# Patient Record
Sex: Female | Born: 1955 | Hispanic: No | Smoking: Former smoker
Health system: Southern US, Community
[De-identification: ages and names within clinical notes are randomized; demographics above are authoritative.]

## PROBLEM LIST (undated history)

## (undated) DIAGNOSIS — Z780 Asymptomatic menopausal state: Secondary | ICD-10-CM

## (undated) DIAGNOSIS — E785 Hyperlipidemia, unspecified: Secondary | ICD-10-CM

## (undated) HISTORY — DX: Asymptomatic menopausal state: Z78.0

## (undated) HISTORY — DX: Hyperlipidemia, unspecified: E78.5

## (undated) HISTORY — PX: NOSE SURGERY: SHX723

## (undated) HISTORY — PX: CARPAL TUNNEL RELEASE: SHX101

---

## 2010-08-02 ENCOUNTER — Encounter: Payer: Self-pay | Admitting: Cardiology

## 2011-04-03 ENCOUNTER — Encounter: Payer: Self-pay | Admitting: Internal Medicine

## 2011-04-03 ENCOUNTER — Ambulatory Visit (HOSPITAL_BASED_OUTPATIENT_CLINIC_OR_DEPARTMENT_OTHER)
Admission: RE | Admit: 2011-04-03 | Discharge: 2011-04-03 | Disposition: A | Discharge status: Home / Self Care | Payer: BC Managed Care – PPO | Source: Ambulatory Visit | Attending: Internal Medicine | Admitting: Internal Medicine

## 2011-04-03 ENCOUNTER — Ambulatory Visit (INDEPENDENT_AMBULATORY_CARE_PROVIDER_SITE_OTHER): Payer: BC Managed Care – PPO | Admitting: Internal Medicine

## 2011-04-03 DIAGNOSIS — R635 Abnormal weight gain: Secondary | ICD-10-CM

## 2011-04-03 DIAGNOSIS — M542 Cervicalgia: Secondary | ICD-10-CM

## 2011-04-03 DIAGNOSIS — R079 Chest pain, unspecified: Secondary | ICD-10-CM

## 2011-04-03 DIAGNOSIS — M5030 Other cervical disc degeneration, unspecified cervical region: Secondary | ICD-10-CM

## 2011-04-04 ENCOUNTER — Encounter: Payer: Self-pay | Admitting: Internal Medicine

## 2011-04-04 DIAGNOSIS — Z87891 Personal history of nicotine dependence: Secondary | ICD-10-CM | POA: Insufficient documentation

## 2011-04-04 DIAGNOSIS — E785 Hyperlipidemia, unspecified: Secondary | ICD-10-CM | POA: Insufficient documentation

## 2011-04-04 NOTE — Progress Notes (Signed)
Subjective:    Patient ID: Lindsey Travis, female    DOB: 1955-09-11, 55 y.o.   MRN: [id number]  HPI New pt here for first visit.  No primary care.  GYN Dr. Tenny Craw.    Pleasant pt. Who tells me she knows she does not take care of herself.  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  Did have recent GYN exam with Dr. Tenny Craw that showed mild hyperlipidemia with LDL of 136  Total 224.  Miral describes recent chest pain that will radiate down L arm  Associated with SOB, no N?V or diaphoresis.  No true exertional component.  She reports she was evaluated at [facility] on 10/25, had a normal EKG and was told to follow up for further work up with a cardiologist but she had to cancel appt.  She really has not had any further chest pain since  She also has neck pain for several months.  Pain with ROM.  She is on no meds and does not like "to take pills"  EKG [place] nonspecific TWI V1 no acute changes.  /risk factor  Pt a former smoker, FH mother CVA,  Pt has mild hyperlipidemia  She is also concerned aabout weight gain  No Known Allergies Past Medical History  Diagnosis Date  . Menopause   . Hyperlipidemia    Past Surgical History  Procedure Date  . Cesarean section 11/29/97, 07/17/00  . Nose surgery    History   Social History  . Marital Status: Legally Separated    Spouse Name: N/A    Number of Children: N/A  . Years of Education: N/A   Occupational History  . Not on file.   Social History Main Topics  . Smoking status: Former Smoker    Quit date: 06/03/2006  . Smokeless tobacco: Never Used  . Alcohol Use: No  . Drug Use: No  . Sexually Active: No   Other Topics Concern  . Not on file   Social History Narrative  . No narrative on file   Family History  Problem Relation Age of Onset  . Heart disease Mother     valve replacement  . Diabetes Father   . Heart disease Father     pacemaker   Patient Active Problem  List  Diagnoses  . Hyperlipidemia   No current outpatient prescriptions on file prior to visit.        Review of Systems    see HPI Objective:   Physical Exam Physical Exam  Nursing note and vitals reviewed.  Constitutional: She is oriented to person, place, and time. She appears well-developed and well-nourished.  HENT:  Head: Normocephalic and atraumatic.  Cardiovascular: Normal rate and regular rhythm. Exam reveals no gallop and no friction rub.  No murmur heard.  Pulmonary/Chest: Breath sounds normal. She has no wheezes. She has no rales.  Neurological: She is alert and oriented to person, place, and time.  Skin: Skin is warm and dry.  Psychiatric: She has a normal mood and affect. Her behavior is normal.        Assessment & Plan:  1)  Atypical chest pain with risk factors in former smoker  Will refer to Dr. Jens Som for further work-up 2)  Neck pain  Will get plain films today.  OK to take Nsaid of choice 3)  Mild Hyperlipidemia  Dash diet for now   Recheck fasting levels 3-6 months 4)  Weight  gain  Will check TSH.  Discuss further at subsequent visit

## 2011-04-04 NOTE — Patient Instructions (Signed)
Keep appt with Dr. Jens Som  To X-ray today

## 2011-04-05 ENCOUNTER — Encounter: Payer: Self-pay | Admitting: Emergency Medicine

## 2011-04-08 ENCOUNTER — Encounter: Payer: Self-pay | Admitting: Internal Medicine

## 2011-04-18 ENCOUNTER — Encounter: Payer: Self-pay | Admitting: Cardiology

## 2011-04-18 ENCOUNTER — Ambulatory Visit (INDEPENDENT_AMBULATORY_CARE_PROVIDER_SITE_OTHER): Payer: BC Managed Care – PPO | Admitting: Cardiology

## 2011-04-18 DIAGNOSIS — R079 Chest pain, unspecified: Secondary | ICD-10-CM

## 2011-04-18 DIAGNOSIS — E785 Hyperlipidemia, unspecified: Secondary | ICD-10-CM

## 2011-04-18 NOTE — Progress Notes (Signed)
HPI: 55 yo female with no prior cardiac history for evaluation of chest pain. Patient states 3 weeks ago she had substernal chest pain. It was described as a pressure and there was radiation to her left upper extremity. There was shortness of breath but no diaphoresis or nausea. The pain was not pleuritic or positional. It resolved spontaneously. It lasted approximately 5-10 minutes. She otherwise denies dyspnea on exertion, orthopnea, PND, pedal edema or syncope. She has had no further chest pain. Because of the above were asked to further evaluate. Note patient was seen at urgent care 2 days after the above.  No current outpatient prescriptions on file.    No Known Allergies  Past Medical History  Diagnosis Date  . Menopause   . Hyperlipidemia     Past Surgical History  Procedure Date  . Cesarean section 11/29/97, 07/17/00  . Nose surgery     History   Social History  . Marital Status: Legally Separated    Spouse Name: N/A    Number of Children: 2  . Years of Education: N/A   Occupational History  .      Teacher   Social History Main Topics  . Smoking status: Former Smoker    Quit date: 06/03/2006  . Smokeless tobacco: Never Used  . Alcohol Use: No  . Drug Use: No  . Sexually Active: No   Other Topics Concern  . Not on file   Social History Narrative  . No narrative on file    Family History  Problem Relation Age of Onset  . Heart disease Mother     valve replacement  . Diabetes Father   . Heart disease Father     pacemaker    ROS:no fevers or chills, productive cough, hemoptysis, dysphasia, odynophagia, melena, hematochezia, dysuria, hematuria, rash, seizure activity, orthopnea, PND, pedal edema, claudication. Remaining systems are negative.  Physical Exam:   Blood pressure 97/59, pulse 63, weight 141 lb 6.4 oz (64.139 kg).  General:  Well developed/well nourished in NAD Skin warm/dry Patient not depressed No peripheral  clubbing Back-normal HEENT-normal/normal eyelids Neck supple/normal carotid upstroke bilaterally; no bruits; no JVD; no thyromegaly chest - CTA/ normal expansion CV - RRR/normal S1 and S2; no murmurs, rubs or gallops;  PMI nondisplaced Abdomen -NT/ND, no HSM, no mass, + bowel sounds, no bruit 2+ femoral pulses, no bruits Ext-no edema, chords, 2+ DP Neuro-grossly nonfocal  ECG 03/28/11 Sinus bradycardia, no ST changes

## 2011-04-18 NOTE — Assessment & Plan Note (Signed)
Symptoms with both typical and atypical features. Schedule stress echocardiogram for risk stratification.

## 2011-04-18 NOTE — Assessment & Plan Note (Signed)
Management per primary care. 

## 2011-04-18 NOTE — Patient Instructions (Signed)
Your physician has requested that you have a stress echocardiogram. For further information please visit [email or web address]. Please follow instruction sheet as given.   

## 2011-04-26 ENCOUNTER — Other Ambulatory Visit (HOSPITAL_COMMUNITY): Payer: BC Managed Care – PPO | Admitting: Radiology

## 2011-05-02 ENCOUNTER — Ambulatory Visit (INDEPENDENT_AMBULATORY_CARE_PROVIDER_SITE_OTHER): Payer: BC Managed Care – PPO | Admitting: Internal Medicine

## 2011-05-02 VITALS — BP 109/69 | HR 68 | Temp 96.8°F | Ht 67.5 in | Wt 136.0 lb

## 2011-05-02 DIAGNOSIS — M5030 Other cervical disc degeneration, unspecified cervical region: Secondary | ICD-10-CM

## 2011-05-02 DIAGNOSIS — N951 Menopausal and female climacteric states: Secondary | ICD-10-CM

## 2011-05-02 DIAGNOSIS — Z780 Asymptomatic menopausal state: Secondary | ICD-10-CM

## 2011-05-02 DIAGNOSIS — E785 Hyperlipidemia, unspecified: Secondary | ICD-10-CM

## 2011-05-02 NOTE — Patient Instructions (Signed)
Schedule CPe with me in 3 months   Come in fasting for labs

## 2011-05-02 NOTE — Progress Notes (Signed)
  Subjective:    Patient ID: Lindsey Travis, female    DOB: November 13, 1955, 55 y.o.   MRN: [id number]  HPI Lindsey Travis is here for follow up.  Neck imaging shows DDD.  She states her neck is improved but she feels pain in both shoulders.  She is pending  A stress ECHO.    She has FH of osteoporosis in mother and would like her bone density  See Lipids  She doesn not want to take meds and has changed her diet to DASH diet.  She is happy she lost 4 lbs.    No Known Allergies Past Medical History  Diagnosis Date  . Menopause   . Hyperlipidemia    Past Surgical History  Procedure Date  . Cesarean section 11/29/97, 07/17/00  . Nose surgery    History   Social History  . Marital Status: Legally Separated    Spouse Name: N/A    Number of Children: 2  . Years of Education: N/A   Occupational History  .      Teacher   Social History Main Topics  . Smoking status: Former Smoker    Quit date: 06/03/2006  . Smokeless tobacco: Never Used  . Alcohol Use: No  . Drug Use: No  . Sexually Active: No   Other Topics Concern  . Not on file   Social History Narrative  . No narrative on file   Family History  Problem Relation Age of Onset  . Heart disease Mother     valve replacement  . Diabetes Father   . Heart disease Father     pacemaker   Patient Active Problem List  Diagnoses  . Hyperlipidemia  . History of tobacco use  . Chest pain   No current outpatient prescriptions on file prior to visit.       Review of Systems See HPI    Objective:   Physical Exam Physical Exam  Nursing note and vitals reviewed.  Constitutional: She is oriented to person, place, and time. She appears well-developed and well-nourished.  HENT:  Head: Normocephalic and atraumatic.  Cardiovascular: Normal rate and regular rhythm. Exam reveals no gallop and no friction rub.  No murmur heard.  Pulmonary/Chest: Breath sounds normal. She has no wheezes. She has no rales.  Neurological: She is alert and  oriented to person, place, and time.  Skin: Skin is warm and dry.  Psychiatric: She has a normal mood and affect. Her behavior is normal.          Assessment & Plan:  1)  DDD Nsaid of choice  OK for gently massage 2)  Hyperlipidemia  contineu DASH diet  Recheck fasting in 3 months at CPE 3)  Menopause FH osteoporosis  Will check bone density  Schedule CPe

## 2011-05-03 ENCOUNTER — Ambulatory Visit (HOSPITAL_COMMUNITY): Payer: BC Managed Care – PPO | Attending: Cardiology | Admitting: Radiology

## 2011-05-03 DIAGNOSIS — R0989 Other specified symptoms and signs involving the circulatory and respiratory systems: Secondary | ICD-10-CM | POA: Insufficient documentation

## 2011-05-03 DIAGNOSIS — R0609 Other forms of dyspnea: Secondary | ICD-10-CM | POA: Insufficient documentation

## 2011-05-03 DIAGNOSIS — R072 Precordial pain: Secondary | ICD-10-CM

## 2011-05-03 DIAGNOSIS — E785 Hyperlipidemia, unspecified: Secondary | ICD-10-CM | POA: Insufficient documentation

## 2011-05-23 ENCOUNTER — Ambulatory Visit
Admission: RE | Admit: 2011-05-23 | Discharge: 2011-05-23 | Disposition: A | Discharge status: Home / Self Care | Payer: BC Managed Care – PPO | Source: Ambulatory Visit | Attending: Internal Medicine | Admitting: Internal Medicine

## 2011-05-23 DIAGNOSIS — Z780 Asymptomatic menopausal state: Secondary | ICD-10-CM

## 2011-06-03 ENCOUNTER — Encounter: Payer: Self-pay | Admitting: Internal Medicine

## 2011-06-03 DIAGNOSIS — M8580 Other specified disorders of bone density and structure, unspecified site: Secondary | ICD-10-CM | POA: Insufficient documentation

## 2011-06-12 ENCOUNTER — Encounter: Payer: Self-pay | Admitting: Emergency Medicine

## 2011-06-12 ENCOUNTER — Telehealth: Payer: Self-pay | Admitting: Emergency Medicine

## 2011-06-12 NOTE — Telephone Encounter (Signed)
error 

## 2012-02-13 ENCOUNTER — Ambulatory Visit (INDEPENDENT_AMBULATORY_CARE_PROVIDER_SITE_OTHER): Payer: BC Managed Care – PPO | Admitting: Internal Medicine

## 2012-02-13 ENCOUNTER — Encounter: Payer: Self-pay | Admitting: Internal Medicine

## 2012-02-13 VITALS — BP 100/54 | HR 74 | Temp 98.0°F | Resp 20 | Wt 135.0 lb

## 2012-02-13 DIAGNOSIS — M549 Dorsalgia, unspecified: Secondary | ICD-10-CM

## 2012-02-13 DIAGNOSIS — M5430 Sciatica, unspecified side: Secondary | ICD-10-CM

## 2012-02-13 MED ORDER — IBUPROFEN 800 MG PO TABS: ORAL_TABLET | ORAL | Status: AC

## 2012-02-13 NOTE — Patient Instructions (Signed)
See me as needed 

## 2012-02-13 NOTE — Progress Notes (Signed)
  Subjective:    Patient ID: Lindsey Travis, female    DOB: 08-Dec-1955, 57 y.o.   MRN: [id number]  HPI Lindsey Travis is here for acute visit.  She describes one week ago she was walking two miles and the next day had severe low back pain in L/S area.    Getting better off and on.  She has parethesias in R thigh off and on.  She tried to do stretching exercises  But made it worse  No dysuria or urgency  No Known Allergies Past Medical History  Diagnosis Date  . Menopause   . Hyperlipidemia    Past Surgical History  Procedure Date  . Cesarean section 11/29/97, 07/17/00  . Nose surgery    History   Social History  . Marital Status: Legally Separated    Spouse Name: N/A    Number of Children: 2  . Years of Education: N/A   Occupational History  .      Teacher   Social History Main Topics  . Smoking status: Former Smoker    Quit date: 06/03/2006  . Smokeless tobacco: Never Used  . Alcohol Use: No  . Drug Use: No  . Sexually Active: No   Other Topics Concern  . Not on file   Social History Narrative  . No narrative on file   Family History  Problem Relation Age of Onset  . Heart disease Mother     valve replacement  . Diabetes Father   . Heart disease Father     pacemaker   Patient Active Problem List  Diagnosis  . Hyperlipidemia  . History of tobacco use  . Chest pain  . DDD (degenerative disc disease), cervical  . Osteopenia   No current outpatient prescriptions on file prior to visit.      Review of Systems    see HPI Objective:   Physical Exam  Physical Exam  Nursing note and vitals reviewed.  Constitutional: She is oriented to person, place, and time. She appears well-developed and well-nourished.  HENT:  Head: Normocephalic and atraumatic.  Cardiovascular: Normal rate and regular rhythm. Exam reveals no gallop and no friction rub.  No murmur heard.  Pulmonary/Chest: Breath sounds normal. She has no wheezes. She has no rales.  Neurological: She is  alert and oriented to person, place, and time.  Skin: Skin is warm and dry.  Psychiatric: She has a normal mood and affect. Her behavior is normal.  Neurologic lower extremities:  Reflexes 2+ symmetric, SLR pos. On right.  Motor 5/5 upper and lower extremeities.    Paraspinal muscle tenderness and stiffness L/S area                  Assessment & Plan:  Sciatica:  OK for Ibuprofen 800 mg tid for the next week.  No weight lifting.   Ice to back  If not improvement see me in office

## 2014-04-04 ENCOUNTER — Encounter: Payer: Self-pay | Admitting: Internal Medicine

## 2015-08-28 ENCOUNTER — Telehealth: Payer: Self-pay | Admitting: Acute Care

## 2015-08-28 NOTE — Telephone Encounter (Signed)
ATC pt to discuss lung cancer screening referral.  A female answered phone, when asked to speak w/ patient, the line was d/c'd I called back and asked to speak with patient and female voice then stated "not to call back" and hung the phone up. Will inform referring provider.

## 2015-08-29 NOTE — Telephone Encounter (Signed)
This is not my patient.

## 2016-12-10 ENCOUNTER — Ambulatory Visit (INDEPENDENT_AMBULATORY_CARE_PROVIDER_SITE_OTHER): Payer: 59 | Admitting: Pulmonary Disease

## 2016-12-10 ENCOUNTER — Encounter: Payer: Self-pay | Admitting: Pulmonary Disease

## 2016-12-10 ENCOUNTER — Ambulatory Visit (INDEPENDENT_AMBULATORY_CARE_PROVIDER_SITE_OTHER)
Admission: RE | Admit: 2016-12-10 | Discharge: 2016-12-10 | Disposition: A | Discharge status: Home / Self Care | Payer: 59 | Source: Ambulatory Visit | Attending: Pulmonary Disease | Admitting: Pulmonary Disease

## 2016-12-10 DIAGNOSIS — R0600 Dyspnea, unspecified: Secondary | ICD-10-CM

## 2016-12-10 NOTE — Patient Instructions (Signed)
   Call or e-mail me if you notice any new breathing problems or have questions before your next appointment.  We will review your test results at your next appointment. Otherwise I will contact you if something needs to be done sooner.  TESTS ORDERED: 1. Full PFTs on or before next appointment 2. CXR PA/LAT today

## 2016-12-10 NOTE — Progress Notes (Signed)
Subjective:    Patient ID: Lindsey Travis, female    DOB: [birth year], 61 y.o.   MRN: [id number]  HPI She reports starting in April she developed a cough as well as chest discomfort in her chest with the cough. She had sinus congestion & drainage at that time as well. She reports she was never treated during that time nor did she have any chest imaging. No wheezing that she recalls during that time. She reports she does have intermittent sweats but no fever or chills. She reports her chest discomfort resolved. She reports she reports she does have coughing with deep inspiration at times. No history of asthma or breathing problems as a young adult or child. She denies any history of pneumonia or bronchitis. No history of allergies or environmental problems preceding this year. No reflux, dyspepsia, or morning brash water taste. No rashes or bruising. She does report problems with dyspnea with exposure to chemical fumes, dust and smoke.   Review of Systems No joint swelling, erythema or stiffness. No dysuria or hematuria. A pertinent 14 point review of systems is negative except as per the history of presenting illness.  No Known Allergies  Current Outpatient Prescriptions on File Prior to Visit  Medication Sig Dispense Refill  . ibuprofen (ADVIL,MOTRIN) 800 MG tablet Take one talblet 3 times a day with food for one week (Patient not taking: Reported on 12/10/2016) 30 tablet 1   No current facility-administered medications on file prior to visit.     Past Medical History:  Diagnosis Date  . Hyperlipidemia   . Menopause     Past Surgical History:  Procedure Laterality Date  . CARPAL TUNNEL RELEASE Right   . CESAREAN SECTION  11/29/97, 07/17/00  . NOSE SURGERY      Family History  Problem Relation Age of Onset  . Heart disease Mother        valve replacement  . Diabetes Father   . Heart disease Father        pacemaker  . Lung disease Father   . Lung cancer Brother   . Lung disease  Paternal Uncle   . Lung disease Paternal Uncle   . Cystic fibrosis Other   . Rheumatologic disease Neg Hx     Social History   Social History  . Marital status: Legally Separated    Spouse name: N/A  . Number of children: 2  . Years of education: N/A   Occupational History  .  American Surveyor, minerals   Social History Main Topics  . Smoking status: Former Smoker    Packs/day: 0.50    Years: 28.00    Start date: 12/04/1975    Quit date: 06/04/2003  . Smokeless tobacco: Never Used  . Alcohol use No  . Drug use: No  . Sexual activity: No   Other Topics Concern  . None   Social History Narrative   [facility] [facility] (12/10/16):   Originally from [place]. She moved  To the [place] in 1997. 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 No pets currently. No bird exposure. Questionable mold exposure. She traveled to [place] 2 years ago but nowhere recently. Has also traveled to [place], [place], & [place]. She has always lived in [place].       Objective:   Physical Exam BP 112/68 (BP Location:  Right Arm, Patient Position: Sitting, Cuff Size: Normal)   Pulse 77   Ht 5' 7.5" (1.715 m)   Wt 137 lb (62.1 kg)   SpO2 98%   BMI 21.14 kg/m  General:  Awake. Alert. No acute distress. Thin female. Integument:  Warm & dry. No rash on exposed skin. No bruising on exposed skin. Extremities:  No cyanosis or clubbing.  Lymphatics:  No appreciated cervical or supraclavicular lymphadenoapthy. HEENT:  Moist mucus membranes. No oral ulcers. No scleral injection or icterus. Narrowed nasal passages with moderate bilateral nasal turbinate swelling. Cardiovascular:  Regular rate. No edema. No appreciable JVD.  Pulmonary:  Good aeration & clear to auscultation bilaterally. Symmetric chest wall expansion. No accessory muscle use on room air. Abdomen: Soft. Normal  bowel sounds. Nondistended. Grossly nontender. Musculoskeletal:  Normal bulk and tone. Hand grip strength 5/5 bilaterally. No joint deformity or effusion appreciated. Neurological:  CN 2-12 grossly in tact. No meningismus. Moving all 4 extremities equally. Symmetric brachioradialis deep tendon reflexes. Psychiatric:  Mood and affect congruent. Speech normal rhythm, rate & tone.     Assessment & Plan:  61 y.o. female with acute illness this spring and prolonged recovery. Patient does have ongoing dyspnea with difficulty performing deep inspiration. With her family history of cystic fibrosis as well as multiple different lung diseases I do question whether or not she could be experiencing the insidious onset of a parenchymal lung disease. However, this could represent prolonged recovery from acute illness or reactive airways disease. I instructed the patient contact my office if she had any new breathing problems or questions before next appointment.  1. Dyspnea: Checking full pulmonary function testing on or before next appointment. Checking chest x-ray PA/LAT today. Further testing pending the results. 2. Follow-up: Patient to return to clinic in 6 weeks or sooner if needed.  Donna ChristenJennings E. Jamison NeighborNestor, M.D. [facility] Pager:  [phone] After 3pm or if no response, call [phone] 10:34 AM 12/10/16

## 2016-12-12 NOTE — Progress Notes (Signed)
Spoke with patient and informed her of results. Pt did not have any questions and verbalized understanding. Nothing further is needed.

## 2017-01-28 ENCOUNTER — Ambulatory Visit (INDEPENDENT_AMBULATORY_CARE_PROVIDER_SITE_OTHER): Payer: 59 | Admitting: Pulmonary Disease

## 2017-01-28 ENCOUNTER — Encounter: Payer: Self-pay | Admitting: Pulmonary Disease

## 2017-01-28 VITALS — BP 98/60 | HR 65 | Ht 65.5 in | Wt 138.4 lb

## 2017-01-28 DIAGNOSIS — R0600 Dyspnea, unspecified: Secondary | ICD-10-CM | POA: Diagnosis not present

## 2017-01-28 LAB — PULMONARY FUNCTION TEST
DL/VA % pred: 90 %
DL/VA: 4.48 ml/min/mmHg/L
DLCO COR: 26.12 ml/min/mmHg
DLCO UNC: 25.54 ml/min/mmHg
DLCO cor % pred: 99 %
DLCO unc % pred: 97 %
FEF 25-75 POST: 3.91 L/s
FEF 25-75 PRE: 3.73 L/s
FEF2575-%Change-Post: 4 %
FEF2575-%PRED-POST: 158 %
FEF2575-%PRED-PRE: 151 %
FEV1-%Change-Post: 1 %
FEV1-%PRED-POST: 129 %
FEV1-%Pred-Pre: 127 %
FEV1-PRE: 3.48 L
FEV1-Post: 3.53 L
FEV1FVC-%CHANGE-POST: 1 %
FEV1FVC-%PRED-PRE: 103 %
FEV6-%CHANGE-POST: -1 %
FEV6-%PRED-POST: 125 %
FEV6-%Pred-Pre: 127 %
FEV6-Post: 4.26 L
FEV6-Pre: 4.31 L
FEV6FVC-%CHANGE-POST: 0 %
FEV6FVC-%PRED-PRE: 103 %
FEV6FVC-%Pred-Post: 102 %
FVC-%Change-Post: 0 %
FVC-%Pred-Post: 122 %
FVC-%Pred-Pre: 122 %
FVC-Post: 4.29 L
FVC-Pre: 4.31 L
POST FEV1/FVC RATIO: 82 %
Post FEV6/FVC ratio: 99 %
Pre FEV1/FVC ratio: 81 %
Pre FEV6/FVC Ratio: 100 %
RV % PRED: 39 %
RV: 0.82 L
TLC % pred: 97 %
TLC: 5.17 L

## 2017-01-28 NOTE — Progress Notes (Signed)
PFT completed today 01/28/17.  

## 2017-01-28 NOTE — Progress Notes (Signed)
Subjective:    Patient ID: Lindsey Travis, female    DOB: 03/23/1956, 61 y.o.   MRN: [id number]  C.C.:  Follow-up for Dyspnea.   HPI Dyspnea: Patient's pulmonary function testing today was completely normal. She reports her breathing has returned to normal. She reports she is walking regularly and nearly 9 miles daily. Things seem to have improved significantly since her illness this Spring. She denies any coughing or wheezing.  Review of Systems No chest pain or pressure. No fever or chills. No abdominal pain or nausea.   No Known Allergies  Current Outpatient Prescriptions on File Prior to Visit  Medication Sig Dispense Refill  . ibuprofen (ADVIL,MOTRIN) 800 MG tablet Take one talblet 3 times a day with food for one week 30 tablet 1   No current facility-administered medications on file prior to visit.     Past Medical History:  Diagnosis Date  . Hyperlipidemia   . Menopause     Past Surgical History:  Procedure Laterality Date  . CARPAL TUNNEL RELEASE Right   . CESAREAN SECTION  11/29/97, 07/17/00  . NOSE SURGERY      Family History  Problem Relation Age of Onset  . Heart disease Mother        valve replacement  . Diabetes Father   . Heart disease Father        pacemaker  . Lung disease Father   . Lung cancer Brother   . Lung disease Paternal Uncle   . Lung disease Paternal Uncle   . Cystic fibrosis Other   . Rheumatologic disease Neg Hx     Social History   Social History  . Marital status: Legally Separated    Spouse name: N/A  . Number of children: 2  . Years of education: N/A   Occupational History  .  American Surveyor, minerals   Social History Main Topics  . Smoking status: Former Smoker    Packs/day: 0.50    Years: 28.00    Start date: 12/04/1975    Quit date: 06/04/2003  . Smokeless tobacco: Never Used  . Alcohol use No  . Drug use: No  . Sexual activity: No   Other Topics Concern  . None   Social History Narrative   [place]  [facility] (12/10/16):   Originally from [place]. She moved  To the [place] in 1997. 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 No pets currently. No bird exposure. Questionable mold exposure. She traveled to [place] 2 years ago but nowhere recently. Has also traveled to [place], [place], & [place]. She has always lived in [place].       Objective:   Physical Exam BP 98/60 (BP Location: Left Arm, Cuff Size: Normal)   Pulse 65   Ht 5' 5.5" (1.664 m)   Wt 138 lb 6.4 oz (62.8 kg)   SpO2 100%   BMI 22.68 kg/m   General:  Healthy female. No distress. Awake..  Integument:  Warm & dry. No rash on exposed skin. No bruising on exposed skin. Extremities:  No cyanosis or clubbing.  HEENT:  Moist mucus membranes. No nasal turbinate swelling. No oral ulcers. Cardiovascular:  Regular rate. No edema. Regular rhythm.  Pulmonary:  Good aeration bilaterally. Clear to auscultation. Normal work of breathing on room air. Abdomen: Soft. Normal bowel sounds. Nondistended.  Musculoskeletal:  Normal bulk and tone. No joint deformity or effusion appreciated.  PFT 01/28/17: FVC 4.31 L (122%) FEV1 3.48 L (127%) FEV1/FVC 0.81 FEF 25-75 3.73 L (151%) negative bronchodilator response TLC 5.17 L (97%) RV 39% ERV 121% DLCO corrected 99%  IMAGING CXR PA/LAT 12/10/16 (personally reviewed by me):  No parenchymal nodule or opacity appreciated. No pleural effusion or thickening. Heart normal in size & mediastinum normal.    Assessment & Plan:  61 y.o. female with previous dyspnea. Dyspnea has essentially resolved. Reviewed her chest x-ray with her today as well as her pulmonary function testing both of which were normal. I instructed the patient to notify me if she develops any new breathing problems as I would be happy to see her back if needed.  1. Dyspnea: Resolved. No need for further  testing. 2. Follow-up: Return to as needed.  Donna Christen Jamison Neighbor, M.D. [facility] Pager:  [phone] After 3pm or if no response, call [phone] 2:52 PM 01/28/17

## 2017-01-28 NOTE — Patient Instructions (Signed)
   Please call our office if you have any new breathing problems.  I will be happy to see you back as needed.

## 2018-02-24 DIAGNOSIS — M79671 Pain in right foot: Secondary | ICD-10-CM | POA: Diagnosis not present

## 2018-03-31 DIAGNOSIS — Z0000 Encounter for general adult medical examination without abnormal findings: Secondary | ICD-10-CM | POA: Diagnosis not present

## 2018-04-06 DIAGNOSIS — Z13220 Encounter for screening for lipoid disorders: Secondary | ICD-10-CM | POA: Diagnosis not present

## 2018-04-06 DIAGNOSIS — Z1159 Encounter for screening for other viral diseases: Secondary | ICD-10-CM | POA: Diagnosis not present

## 2018-04-06 DIAGNOSIS — L659 Nonscarring hair loss, unspecified: Secondary | ICD-10-CM | POA: Diagnosis not present

## 2018-05-19 DIAGNOSIS — Z6822 Body mass index (BMI) 22.0-22.9, adult: Secondary | ICD-10-CM | POA: Diagnosis not present

## 2018-05-19 DIAGNOSIS — Z01419 Encounter for gynecological examination (general) (routine) without abnormal findings: Secondary | ICD-10-CM | POA: Diagnosis not present

## 2018-05-19 DIAGNOSIS — Z1231 Encounter for screening mammogram for malignant neoplasm of breast: Secondary | ICD-10-CM | POA: Diagnosis not present

## 2018-05-28 DIAGNOSIS — Z780 Asymptomatic menopausal state: Secondary | ICD-10-CM | POA: Diagnosis not present

## 2018-05-28 DIAGNOSIS — Z8262 Family history of osteoporosis: Secondary | ICD-10-CM | POA: Diagnosis not present

## 2018-05-28 DIAGNOSIS — M8589 Other specified disorders of bone density and structure, multiple sites: Secondary | ICD-10-CM | POA: Diagnosis not present

## 2018-11-30 DIAGNOSIS — Z20828 Contact with and (suspected) exposure to other viral communicable diseases: Secondary | ICD-10-CM | POA: Diagnosis not present

## 2018-12-04 DIAGNOSIS — Z20828 Contact with and (suspected) exposure to other viral communicable diseases: Secondary | ICD-10-CM | POA: Diagnosis not present

## 2018-12-06 DIAGNOSIS — Z20828 Contact with and (suspected) exposure to other viral communicable diseases: Secondary | ICD-10-CM | POA: Diagnosis not present

## 2019-03-04 DIAGNOSIS — Z03818 Encounter for observation for suspected exposure to other biological agents ruled out: Secondary | ICD-10-CM | POA: Diagnosis not present

## 2019-04-19 DIAGNOSIS — L659 Nonscarring hair loss, unspecified: Secondary | ICD-10-CM | POA: Diagnosis not present

## 2019-04-19 DIAGNOSIS — Z13220 Encounter for screening for lipoid disorders: Secondary | ICD-10-CM | POA: Diagnosis not present

## 2019-04-19 DIAGNOSIS — Z131 Encounter for screening for diabetes mellitus: Secondary | ICD-10-CM | POA: Diagnosis not present

## 2019-06-25 IMAGING — DX DG CHEST 2V
2 series · 2 of 2 positions shown · non-contrast
Comparison: None.

CLINICAL DATA: Routine. No complaints. Ex smoker.

EXAM:
CHEST  2 VIEW

[chest pa]
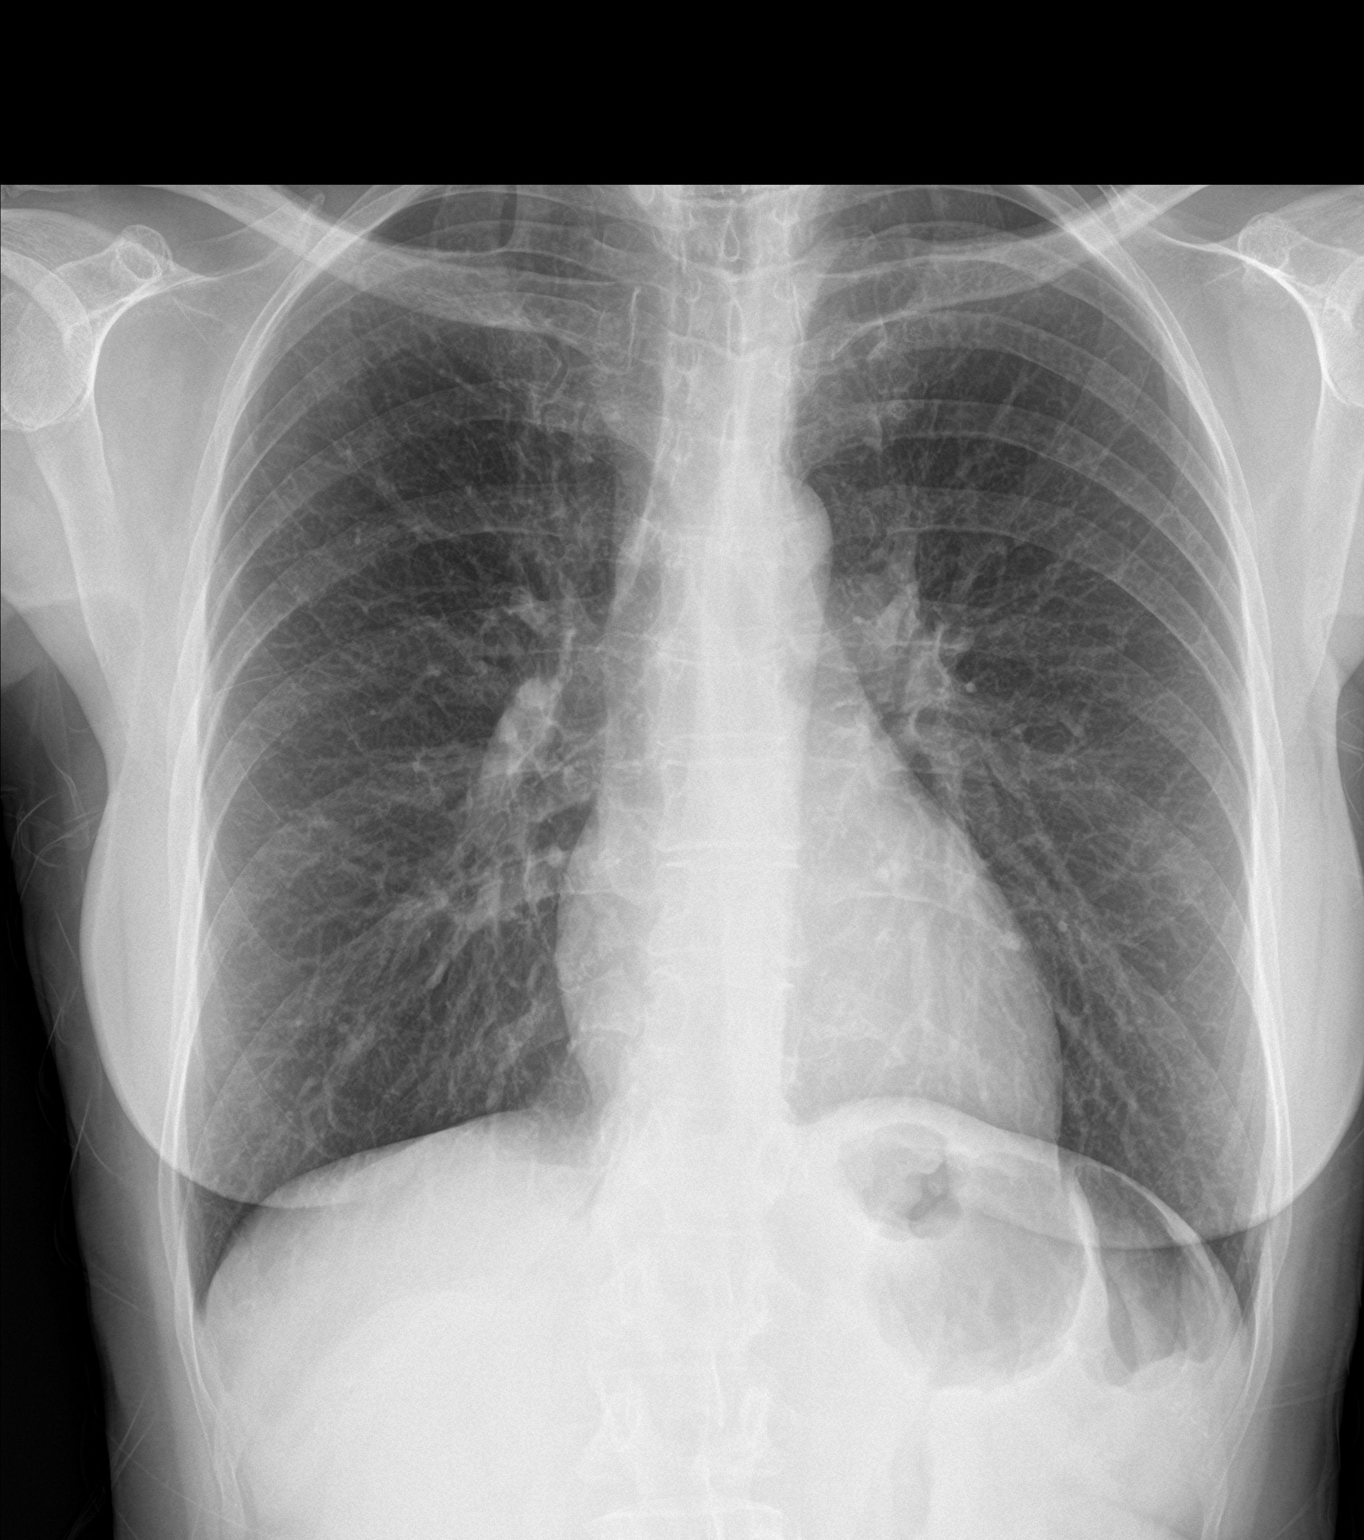

[chest lat]
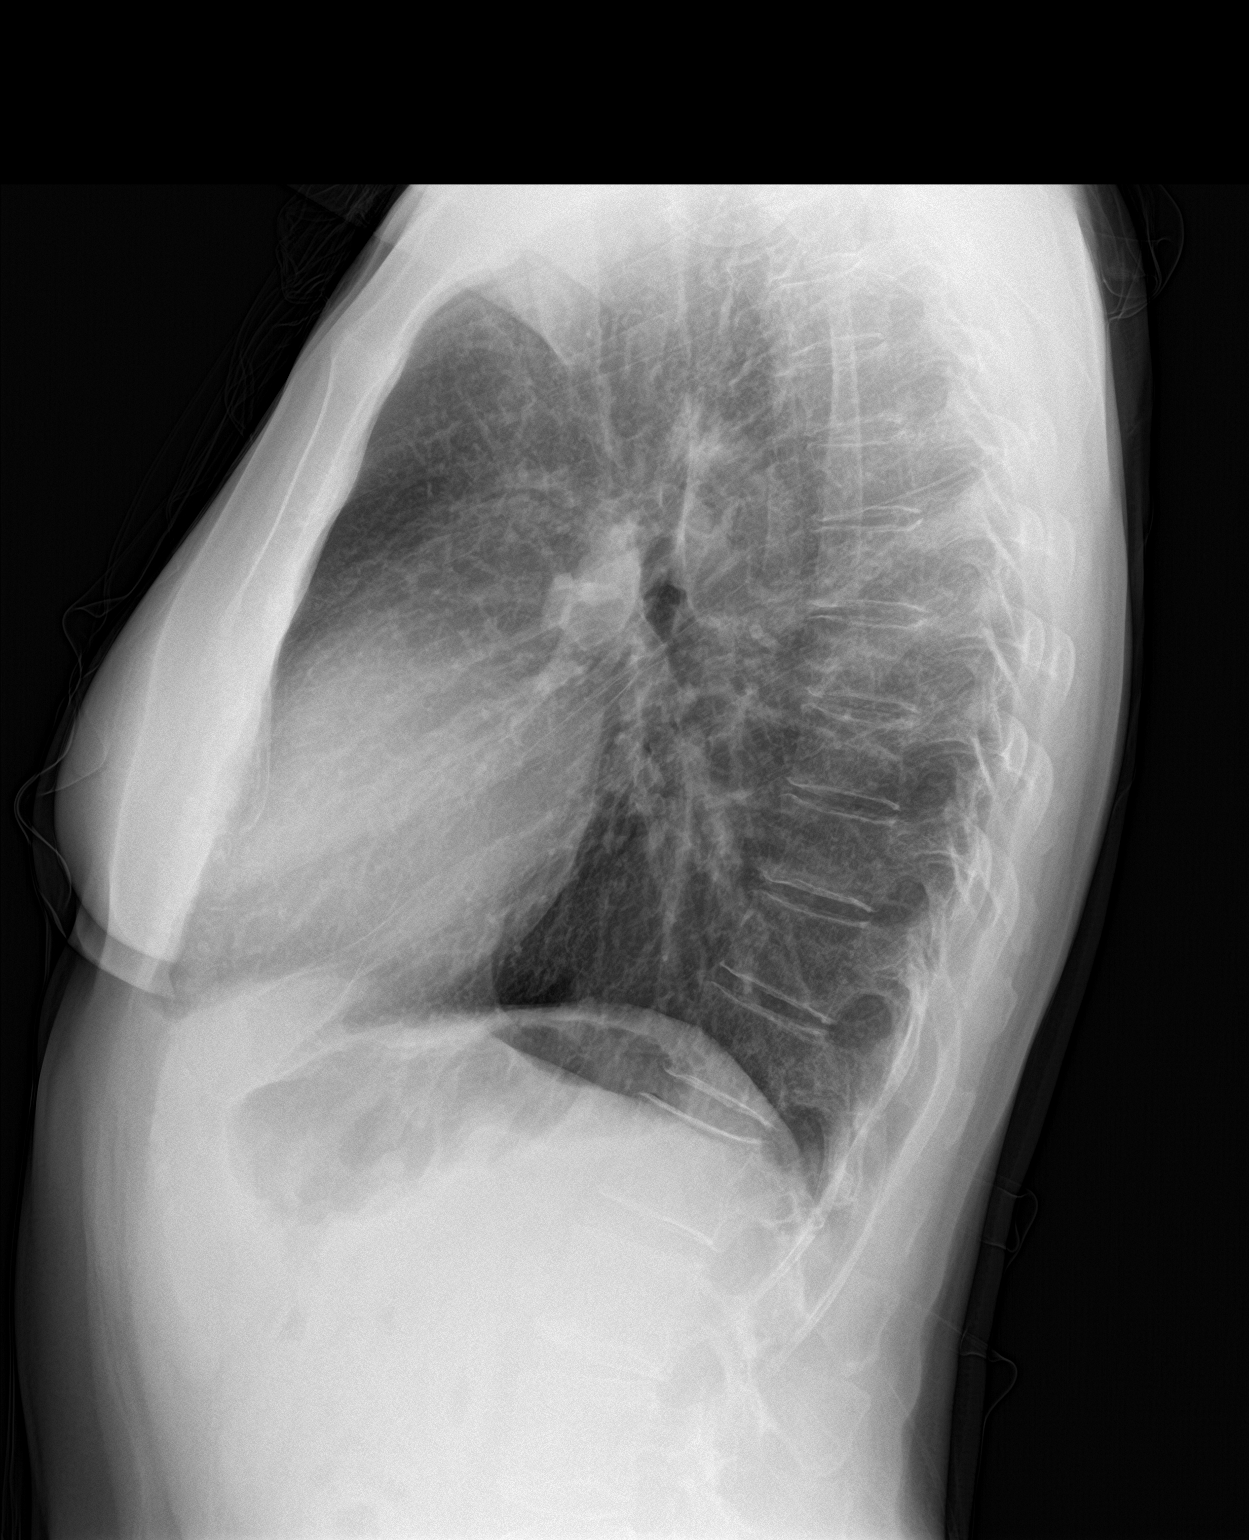

[2 of 2 positions shown; findings below may reference images not displayed]

FINDINGS: The heart size and mediastinal contours are within normal limits.
Both lungs are clear. The visualized skeletal structures are
unremarkable.
IMPRESSION: No active cardiopulmonary disease.
